# Patient Record
Sex: Female | Born: 1960
Health system: Southern US, Community
[De-identification: ages and names within clinical notes are randomized; demographics above are authoritative.]

## PROBLEM LIST (undated history)

## (undated) DIAGNOSIS — F32A Depression, unspecified: Secondary | ICD-10-CM

## (undated) DIAGNOSIS — F329 Major depressive disorder, single episode, unspecified: Secondary | ICD-10-CM

## (undated) DIAGNOSIS — F419 Anxiety disorder, unspecified: Secondary | ICD-10-CM

## (undated) HISTORY — PX: TUBAL LIGATION: SHX77

## (undated) HISTORY — DX: Depression, unspecified: F32.A

## (undated) HISTORY — PX: FOOT SURGERY: SHX648

## (undated) HISTORY — DX: Anxiety disorder, unspecified: F41.9

## (undated) HISTORY — DX: Major depressive disorder, single episode, unspecified: F32.9

---

## 2003-05-22 ENCOUNTER — Encounter: Admission: RE | Admit: 2003-05-22 | Discharge: 2003-05-22 | Payer: Self-pay | Admitting: Family Medicine

## 2004-07-29 ENCOUNTER — Other Ambulatory Visit: Admission: RE | Admit: 2004-07-29 | Discharge: 2004-07-29 | Payer: Self-pay | Admitting: Family Medicine

## 2007-01-18 ENCOUNTER — Other Ambulatory Visit: Admission: RE | Admit: 2007-01-18 | Discharge: 2007-01-18 | Payer: Self-pay | Admitting: Family Medicine

## 2009-12-13 ENCOUNTER — Other Ambulatory Visit: Admission: RE | Admit: 2009-12-13 | Discharge: 2009-12-13 | Payer: Self-pay | Admitting: Family Medicine

## 2013-02-28 ENCOUNTER — Other Ambulatory Visit (HOSPITAL_COMMUNITY)
Admission: RE | Admit: 2013-02-28 | Discharge: 2013-02-28 | Disposition: A | Discharge status: Home / Self Care | Payer: Managed Care, Other (non HMO) | Source: Ambulatory Visit | Attending: Family Medicine | Admitting: Family Medicine

## 2013-02-28 ENCOUNTER — Other Ambulatory Visit: Payer: Self-pay | Admitting: Physician Assistant

## 2013-02-28 DIAGNOSIS — Z0000 Encounter for general adult medical examination without abnormal findings: Secondary | ICD-10-CM | POA: Insufficient documentation

## 2014-06-11 ENCOUNTER — Ambulatory Visit (INDEPENDENT_AMBULATORY_CARE_PROVIDER_SITE_OTHER): Payer: Managed Care, Other (non HMO) | Admitting: Family Medicine

## 2014-06-11 VITALS — BP 122/84 | HR 68 | Temp 98.0°F | Resp 16 | Ht 68.0 in | Wt 171.8 lb

## 2014-06-11 DIAGNOSIS — L309 Dermatitis, unspecified: Secondary | ICD-10-CM

## 2014-06-11 MED ORDER — TRIAMCINOLONE ACETONIDE 0.1 % EX CREA: 1.0000 "application " | TOPICAL_CREAM | Freq: Two times a day (BID) | CUTANEOUS | Status: AC

## 2014-06-11 MED ORDER — DOXYCYCLINE HYCLATE 100 MG PO CAPS: 100.0000 mg | ORAL_CAPSULE | Freq: Two times a day (BID) | ORAL | Status: AC

## 2014-06-11 NOTE — Progress Notes (Signed)
Subjective: 53 year old lady who has a rash on her elbows. It is been more prominent on the right but is on the left also. She is noticed it over the past couple of weeks. She is tried to medicate with some Neosporin. She has talked to friends about it and finally didn't know what was causing came in. She asked whether it could be Lyme disease. She does have dogs that she isn't engorged person. She has not had a tick bite. No rash elsewhere body. The area on the right elbow is about 4 x 6 itches, erythematous, uniformly red but well-defined borders. No palpable lesions. The left elbow is smaller, maybe about 3 inches in diameter. No other rashes on her trunk or legs or elsewhere. She does not feel bad. No itching at all.  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 However she has times that she could be exposed to fuels or chemicals.  Objective: The appearing lady in no major distress. She has the erythematous areas as described above. Not palpable.  Assessment: Nonspecific dermatitis  Plan: Decided to treat generically with doxycycline twice daily in case of the slim chance of infection. I informed her that I do not know of Lyme disease ever causing more than one spot. It can cause a generalized rash in addition to the primary site, but this would be atypical.  Also will use some topical hydrocortisone cream. If it is doing worse at anytime she is to return. If it is not better within 7-10 days like her to return

## 2014-06-11 NOTE — Patient Instructions (Signed)
Use the triamcinolone cream twice daily on the rash  Take the doxycycline one twice daily as directed  Come in if worse at anytime, otherwise if not improved come in in 7-10 days. It would probably be best if I could recheck it. You can always call and ask when Dr. Alwyn RenHopper is here and come in on one of my shift so I can look at it. When you sign in you just need to request me.

## 2015-10-04 DIAGNOSIS — R42 Dizziness and giddiness: Secondary | ICD-10-CM | POA: Diagnosis not present

## 2015-10-04 DIAGNOSIS — R001 Bradycardia, unspecified: Secondary | ICD-10-CM | POA: Diagnosis not present

## 2015-10-04 DIAGNOSIS — Z720 Tobacco use: Secondary | ICD-10-CM | POA: Diagnosis not present

## 2015-10-28 DIAGNOSIS — R0602 Shortness of breath: Secondary | ICD-10-CM | POA: Diagnosis not present

## 2015-10-28 DIAGNOSIS — I10 Essential (primary) hypertension: Secondary | ICD-10-CM | POA: Diagnosis not present

## 2015-10-28 DIAGNOSIS — R42 Dizziness and giddiness: Secondary | ICD-10-CM | POA: Diagnosis not present

## 2015-10-28 DIAGNOSIS — Z8249 Family history of ischemic heart disease and other diseases of the circulatory system: Secondary | ICD-10-CM | POA: Diagnosis not present

## 2015-10-29 DIAGNOSIS — Z1231 Encounter for screening mammogram for malignant neoplasm of breast: Secondary | ICD-10-CM | POA: Diagnosis not present

## 2015-11-01 DIAGNOSIS — Z0000 Encounter for general adult medical examination without abnormal findings: Secondary | ICD-10-CM | POA: Diagnosis not present

## 2015-11-01 DIAGNOSIS — I10 Essential (primary) hypertension: Secondary | ICD-10-CM | POA: Diagnosis not present

## 2015-11-01 DIAGNOSIS — E785 Hyperlipidemia, unspecified: Secondary | ICD-10-CM | POA: Diagnosis not present

## 2015-11-01 DIAGNOSIS — E559 Vitamin D deficiency, unspecified: Secondary | ICD-10-CM | POA: Diagnosis not present

## 2015-11-01 DIAGNOSIS — Z720 Tobacco use: Secondary | ICD-10-CM | POA: Diagnosis not present

## 2015-11-15 ENCOUNTER — Other Ambulatory Visit: Payer: Self-pay | Admitting: Cardiology

## 2015-11-15 DIAGNOSIS — Z8249 Family history of ischemic heart disease and other diseases of the circulatory system: Secondary | ICD-10-CM

## 2015-11-15 DIAGNOSIS — R0609 Other forms of dyspnea: Secondary | ICD-10-CM | POA: Diagnosis not present

## 2015-11-15 DIAGNOSIS — R0600 Dyspnea, unspecified: Secondary | ICD-10-CM | POA: Diagnosis not present

## 2015-11-23 ENCOUNTER — Other Ambulatory Visit: Payer: Managed Care, Other (non HMO)

## 2016-01-06 ENCOUNTER — Ambulatory Visit
Admission: RE | Admit: 2016-01-06 | Discharge: 2016-01-06 | Disposition: A | Discharge status: Home / Self Care | Payer: No Typology Code available for payment source | Source: Ambulatory Visit | Attending: Cardiology | Admitting: Cardiology

## 2016-01-06 DIAGNOSIS — Z8249 Family history of ischemic heart disease and other diseases of the circulatory system: Secondary | ICD-10-CM

## 2016-02-01 DIAGNOSIS — H35351 Cystoid macular degeneration, right eye: Secondary | ICD-10-CM | POA: Diagnosis not present

## 2016-11-13 DIAGNOSIS — Z1231 Encounter for screening mammogram for malignant neoplasm of breast: Secondary | ICD-10-CM | POA: Diagnosis not present

## 2016-11-17 ENCOUNTER — Other Ambulatory Visit (HOSPITAL_COMMUNITY)
Admission: RE | Admit: 2016-11-17 | Discharge: 2016-11-17 | Disposition: A | Discharge status: Home / Self Care | Payer: BLUE CROSS/BLUE SHIELD | Source: Ambulatory Visit | Attending: Physician Assistant | Admitting: Physician Assistant

## 2016-11-17 ENCOUNTER — Other Ambulatory Visit: Payer: Self-pay | Admitting: Physician Assistant

## 2016-11-17 DIAGNOSIS — E559 Vitamin D deficiency, unspecified: Secondary | ICD-10-CM | POA: Diagnosis not present

## 2016-11-17 DIAGNOSIS — Z0000 Encounter for general adult medical examination without abnormal findings: Secondary | ICD-10-CM | POA: Insufficient documentation

## 2016-11-17 DIAGNOSIS — Z124 Encounter for screening for malignant neoplasm of cervix: Secondary | ICD-10-CM | POA: Diagnosis not present

## 2016-11-17 DIAGNOSIS — Z720 Tobacco use: Secondary | ICD-10-CM | POA: Diagnosis not present

## 2016-11-17 DIAGNOSIS — I10 Essential (primary) hypertension: Secondary | ICD-10-CM | POA: Diagnosis not present

## 2016-11-17 DIAGNOSIS — E785 Hyperlipidemia, unspecified: Secondary | ICD-10-CM | POA: Diagnosis not present

## 2016-11-17 DIAGNOSIS — F418 Other specified anxiety disorders: Secondary | ICD-10-CM | POA: Diagnosis not present

## 2016-11-22 LAB — CYTOLOGY - PAP
Adequacy: ABSENT
Diagnosis: NEGATIVE
HPV: DETECTED — AB

## 2016-11-23 MED ORDER — FENTANYL CITRATE (PF) 100 MCG/2ML IJ SOLN
INTRAMUSCULAR | Status: AC
  Filled 2016-11-23: qty 2

## 2017-07-24 IMAGING — CT CT HEART SCORING
1 of 2 series · 11 of 20 positions shown, 14 images · non-contrast
Comparison: None.

CLINICAL DATA: Family history of ischemic heart disease.  Smoker.

EXAM:
CT HEART FOR CALCIUM SCORING
TECHNIQUE: CT heart was performed on a 256 channel system using prospective ECG
gating. A scout and noncontrast exam (for calcium scoring) were
performed. Note that this exam targets the heart and the chest was
not imaged in its entirety.

[Series 2: smartscore - gated 0.4 sec · axial · 0.49mm/px · z∈[-264,-149]mm · 11 of 56 slices shown, 14 images]
[im 5/56  vessel]
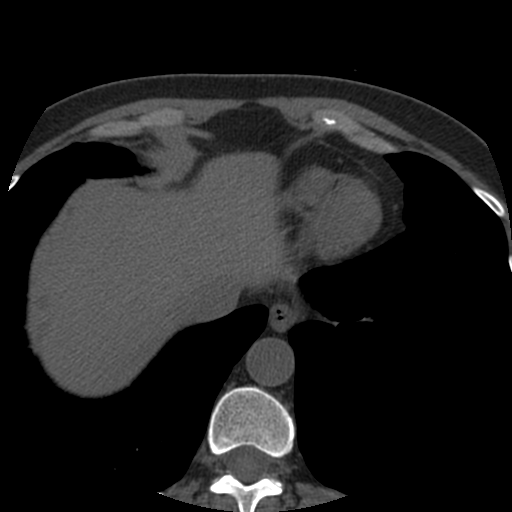
[im 5/56  lung]
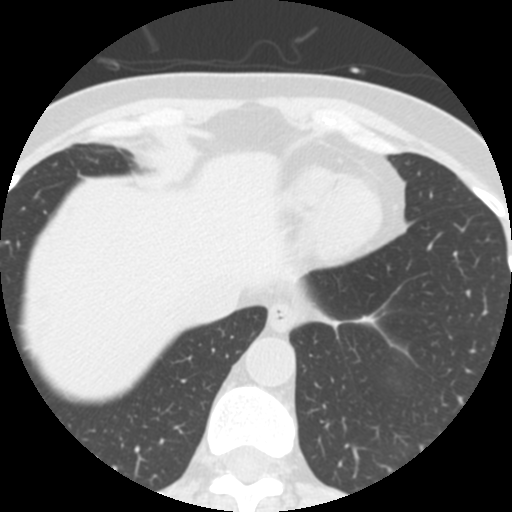
[im 10/56  vessel]
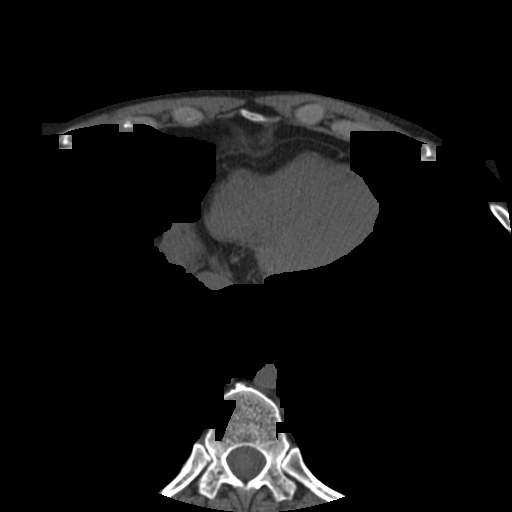
[im 14/56  vessel]
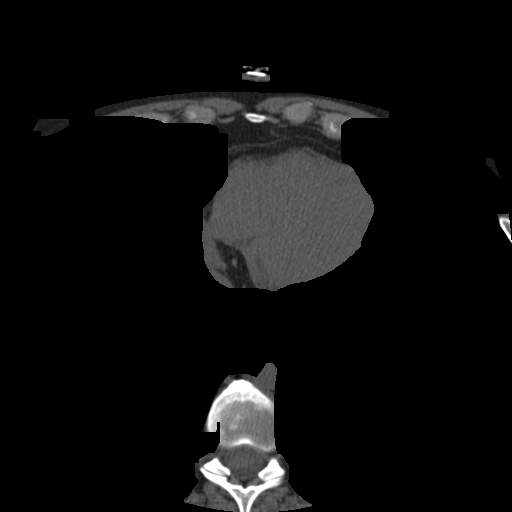
[im 19/56  vessel]
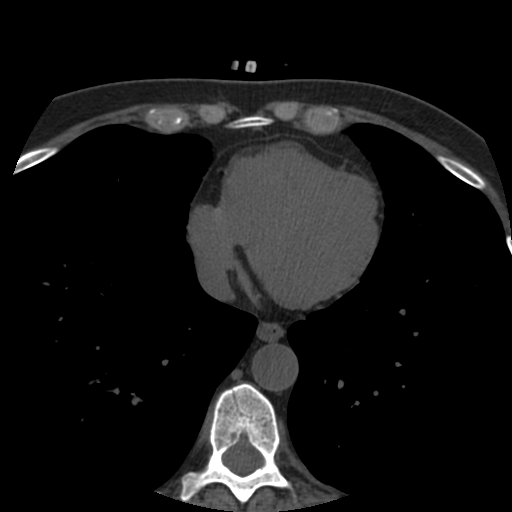
[im 23/56  vessel]
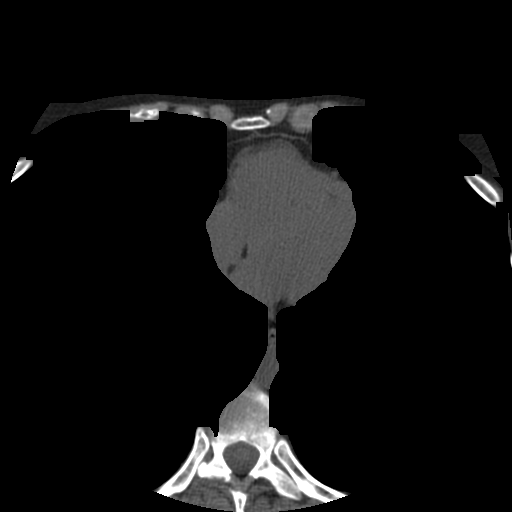
[im 23/56  lung]
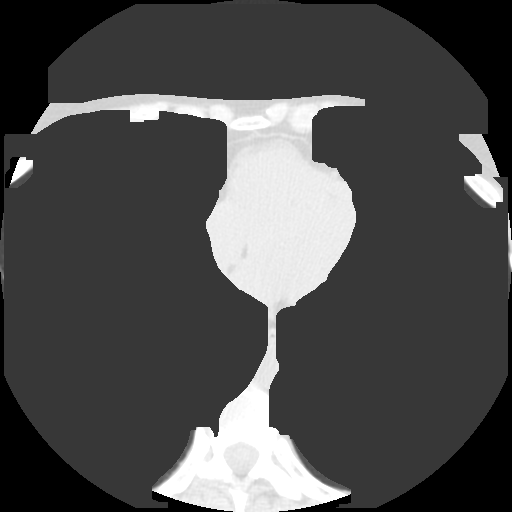
[im 28/56  vessel]
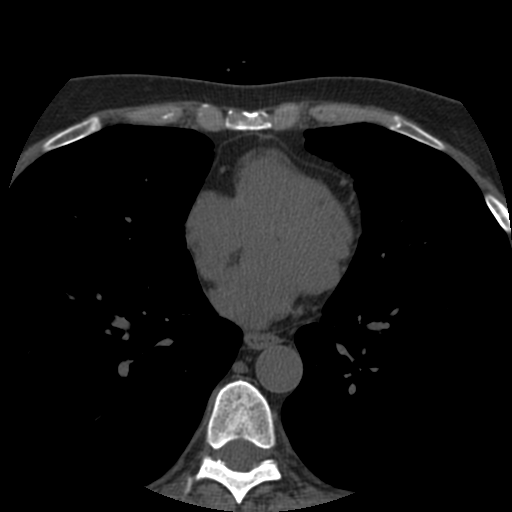
[im 33/56  vessel]
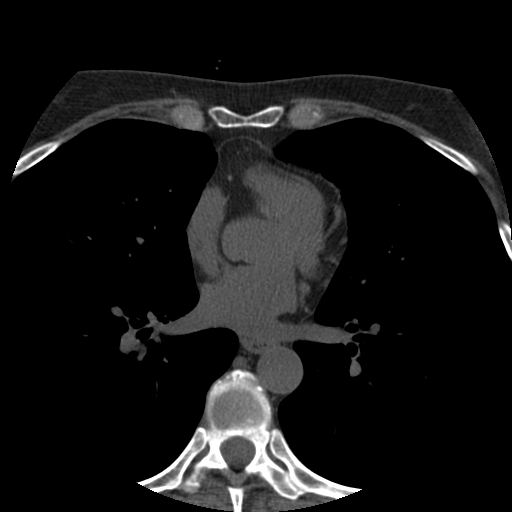
[im 37/56  vessel]
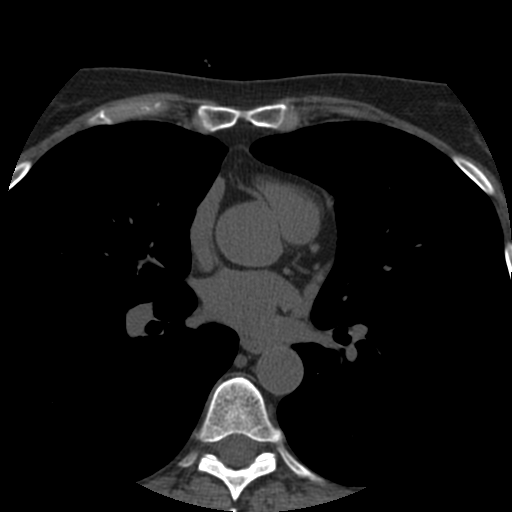
[im 42/56  vessel]
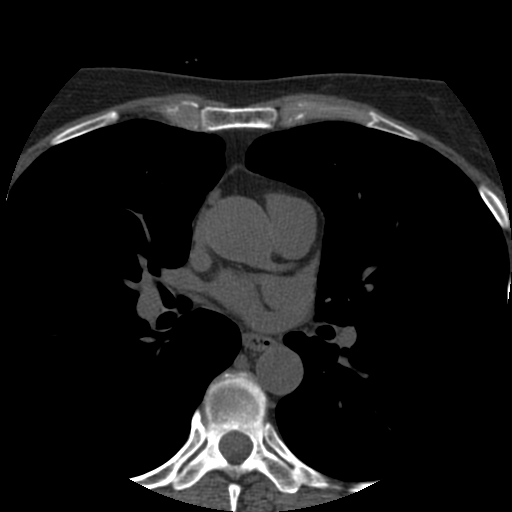
[im 42/56  lung]
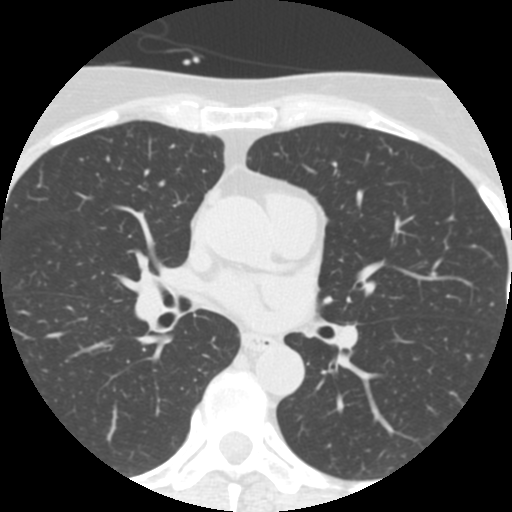
[im 46/56  vessel]
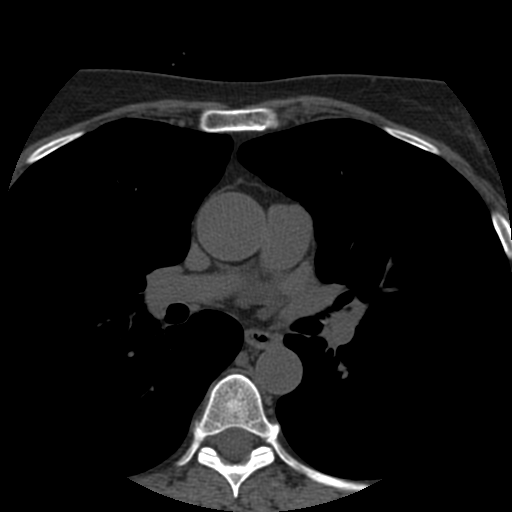
[im 51/56  vessel]
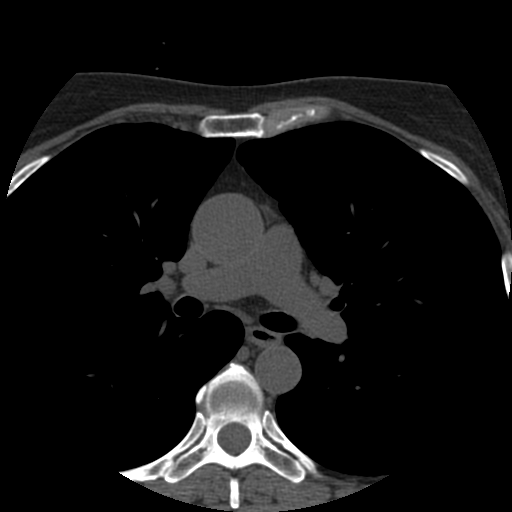

[11 of 20 positions shown; findings below may reference images not displayed]

FINDINGS: Technical quality: Excellent

CORONARY CALCIUM

Total Agatston Score: 0

[HOSPITAL] percentile:  0

AORTA AND PULMONARY MEASUREMENTS:

Ascending aorta ( <  40 mm): 32

Descending aorta ( <  40 mm): 23

Main pulmonary artery:  ( <  30 mm): 22

EXTRACARDIAC FINDINGS:

Limited view of the lung parenchyma demonstrates no suspicious
nodularity. Airways are normal.

Limited view of the mediastinum demonstrates no adenopathy.
Esophagus normal.

Limited view of the upper abdomen unremarkable.

Limited view of the skeleton and chest wall is unremarkable.
IMPRESSION: 1. No coronary artery calcification.  Calcium score equals 0
2. No significant extracardiac findings.

## 2017-12-24 ENCOUNTER — Other Ambulatory Visit (HOSPITAL_COMMUNITY)
Admission: RE | Admit: 2017-12-24 | Discharge: 2017-12-24 | Disposition: A | Discharge status: Home / Self Care | Payer: BLUE CROSS/BLUE SHIELD | Source: Ambulatory Visit | Attending: Physician Assistant | Admitting: Physician Assistant

## 2017-12-24 ENCOUNTER — Other Ambulatory Visit: Payer: Self-pay | Admitting: Physician Assistant

## 2017-12-24 DIAGNOSIS — E559 Vitamin D deficiency, unspecified: Secondary | ICD-10-CM | POA: Diagnosis not present

## 2017-12-24 DIAGNOSIS — Z0000 Encounter for general adult medical examination without abnormal findings: Secondary | ICD-10-CM | POA: Diagnosis not present

## 2017-12-24 DIAGNOSIS — Z136 Encounter for screening for cardiovascular disorders: Secondary | ICD-10-CM | POA: Diagnosis not present

## 2017-12-24 DIAGNOSIS — F418 Other specified anxiety disorders: Secondary | ICD-10-CM | POA: Diagnosis not present

## 2017-12-24 DIAGNOSIS — I10 Essential (primary) hypertension: Secondary | ICD-10-CM | POA: Diagnosis not present

## 2017-12-24 DIAGNOSIS — E785 Hyperlipidemia, unspecified: Secondary | ICD-10-CM | POA: Diagnosis not present

## 2017-12-26 LAB — CYTOLOGY - PAP
Adequacy: ABSENT
Diagnosis: NEGATIVE
HPV: DETECTED — AB

## 2018-04-22 DIAGNOSIS — Z86010 Personal history of colonic polyps: Secondary | ICD-10-CM | POA: Diagnosis not present

## 2018-04-22 DIAGNOSIS — Z8371 Family history of colonic polyps: Secondary | ICD-10-CM | POA: Diagnosis not present

## 2018-04-22 DIAGNOSIS — D126 Benign neoplasm of colon, unspecified: Secondary | ICD-10-CM | POA: Diagnosis not present

## 2018-04-24 DIAGNOSIS — D126 Benign neoplasm of colon, unspecified: Secondary | ICD-10-CM | POA: Diagnosis not present

## 2018-07-24 DIAGNOSIS — H35033 Hypertensive retinopathy, bilateral: Secondary | ICD-10-CM | POA: Diagnosis not present

## 2018-12-27 DIAGNOSIS — E785 Hyperlipidemia, unspecified: Secondary | ICD-10-CM | POA: Diagnosis not present

## 2018-12-27 DIAGNOSIS — I10 Essential (primary) hypertension: Secondary | ICD-10-CM | POA: Diagnosis not present

## 2018-12-27 DIAGNOSIS — F418 Other specified anxiety disorders: Secondary | ICD-10-CM | POA: Diagnosis not present

## 2018-12-27 DIAGNOSIS — E559 Vitamin D deficiency, unspecified: Secondary | ICD-10-CM | POA: Diagnosis not present

## 2018-12-27 DIAGNOSIS — Z0000 Encounter for general adult medical examination without abnormal findings: Secondary | ICD-10-CM | POA: Diagnosis not present

## 2019-01-01 ENCOUNTER — Other Ambulatory Visit: Payer: Self-pay | Admitting: Physician Assistant

## 2019-01-01 ENCOUNTER — Other Ambulatory Visit (HOSPITAL_COMMUNITY)
Admission: RE | Admit: 2019-01-01 | Discharge: 2019-01-01 | Disposition: A | Discharge status: Home / Self Care | Payer: BC Managed Care – PPO | Source: Ambulatory Visit | Attending: Physician Assistant | Admitting: Physician Assistant

## 2019-01-01 DIAGNOSIS — R87810 Cervical high risk human papillomavirus (HPV) DNA test positive: Secondary | ICD-10-CM | POA: Diagnosis not present

## 2019-01-01 DIAGNOSIS — Z1151 Encounter for screening for human papillomavirus (HPV): Secondary | ICD-10-CM | POA: Diagnosis not present

## 2019-01-01 DIAGNOSIS — E559 Vitamin D deficiency, unspecified: Secondary | ICD-10-CM | POA: Diagnosis not present

## 2019-01-01 DIAGNOSIS — Z0000 Encounter for general adult medical examination without abnormal findings: Secondary | ICD-10-CM | POA: Diagnosis not present

## 2019-01-01 DIAGNOSIS — Z0001 Encounter for general adult medical examination with abnormal findings: Secondary | ICD-10-CM | POA: Diagnosis not present

## 2019-01-01 DIAGNOSIS — Z23 Encounter for immunization: Secondary | ICD-10-CM | POA: Diagnosis not present

## 2019-01-01 DIAGNOSIS — I10 Essential (primary) hypertension: Secondary | ICD-10-CM | POA: Diagnosis not present

## 2019-01-01 DIAGNOSIS — E785 Hyperlipidemia, unspecified: Secondary | ICD-10-CM | POA: Diagnosis not present

## 2019-01-02 LAB — CYTOLOGY - PAP
Adequacy: ABSENT
Diagnosis: NEGATIVE
HPV: DETECTED — AB

## 2019-05-22 DIAGNOSIS — J029 Acute pharyngitis, unspecified: Secondary | ICD-10-CM | POA: Diagnosis not present

## 2019-05-22 DIAGNOSIS — Z20828 Contact with and (suspected) exposure to other viral communicable diseases: Secondary | ICD-10-CM | POA: Diagnosis not present

## 2019-12-08 DIAGNOSIS — I10 Essential (primary) hypertension: Secondary | ICD-10-CM | POA: Diagnosis not present

## 2019-12-08 DIAGNOSIS — E785 Hyperlipidemia, unspecified: Secondary | ICD-10-CM | POA: Diagnosis not present

## 2019-12-08 DIAGNOSIS — F418 Other specified anxiety disorders: Secondary | ICD-10-CM | POA: Diagnosis not present

## 2019-12-08 DIAGNOSIS — E559 Vitamin D deficiency, unspecified: Secondary | ICD-10-CM | POA: Diagnosis not present

## 2020-01-01 DIAGNOSIS — Z720 Tobacco use: Secondary | ICD-10-CM | POA: Diagnosis not present

## 2020-01-01 DIAGNOSIS — F418 Other specified anxiety disorders: Secondary | ICD-10-CM | POA: Diagnosis not present

## 2020-01-01 DIAGNOSIS — R42 Dizziness and giddiness: Secondary | ICD-10-CM | POA: Diagnosis not present

## 2020-03-01 DIAGNOSIS — H35033 Hypertensive retinopathy, bilateral: Secondary | ICD-10-CM | POA: Diagnosis not present

## 2020-04-15 DIAGNOSIS — Z20822 Contact with and (suspected) exposure to covid-19: Secondary | ICD-10-CM | POA: Diagnosis not present

## 2020-04-20 DIAGNOSIS — Z20822 Contact with and (suspected) exposure to covid-19: Secondary | ICD-10-CM | POA: Diagnosis not present

## 2020-04-23 DIAGNOSIS — Z20822 Contact with and (suspected) exposure to covid-19: Secondary | ICD-10-CM | POA: Diagnosis not present

## 2020-04-26 DIAGNOSIS — Z20822 Contact with and (suspected) exposure to covid-19: Secondary | ICD-10-CM | POA: Diagnosis not present

## 2020-05-06 DIAGNOSIS — Z20822 Contact with and (suspected) exposure to covid-19: Secondary | ICD-10-CM | POA: Diagnosis not present

## 2020-05-10 DIAGNOSIS — Z20822 Contact with and (suspected) exposure to covid-19: Secondary | ICD-10-CM | POA: Diagnosis not present

## 2020-05-20 DIAGNOSIS — Z20822 Contact with and (suspected) exposure to covid-19: Secondary | ICD-10-CM | POA: Diagnosis not present

## 2020-05-24 DIAGNOSIS — Z20822 Contact with and (suspected) exposure to covid-19: Secondary | ICD-10-CM | POA: Diagnosis not present

## 2020-06-03 DIAGNOSIS — Z20822 Contact with and (suspected) exposure to covid-19: Secondary | ICD-10-CM | POA: Diagnosis not present

## 2020-06-07 ENCOUNTER — Other Ambulatory Visit: Payer: Self-pay | Admitting: Physician Assistant

## 2020-06-07 ENCOUNTER — Other Ambulatory Visit (HOSPITAL_COMMUNITY)
Admission: RE | Admit: 2020-06-07 | Discharge: 2020-06-07 | Disposition: A | Discharge status: Home / Self Care | Payer: BC Managed Care – PPO | Source: Ambulatory Visit | Attending: Physician Assistant | Admitting: Physician Assistant

## 2020-06-07 DIAGNOSIS — Z720 Tobacco use: Secondary | ICD-10-CM | POA: Diagnosis not present

## 2020-06-07 DIAGNOSIS — E785 Hyperlipidemia, unspecified: Secondary | ICD-10-CM | POA: Diagnosis not present

## 2020-06-07 DIAGNOSIS — Z0000 Encounter for general adult medical examination without abnormal findings: Secondary | ICD-10-CM | POA: Diagnosis not present

## 2020-06-07 DIAGNOSIS — I10 Essential (primary) hypertension: Secondary | ICD-10-CM | POA: Diagnosis not present

## 2020-06-07 DIAGNOSIS — Z23 Encounter for immunization: Secondary | ICD-10-CM | POA: Diagnosis not present

## 2020-06-07 DIAGNOSIS — F418 Other specified anxiety disorders: Secondary | ICD-10-CM | POA: Diagnosis not present

## 2020-06-10 LAB — CYTOLOGY - PAP
Adequacy: ABSENT
Comment: NEGATIVE
Diagnosis: NEGATIVE
High risk HPV: POSITIVE — AB

## 2020-06-18 DIAGNOSIS — U071 COVID-19: Secondary | ICD-10-CM | POA: Diagnosis not present

## 2021-02-15 DIAGNOSIS — Z1231 Encounter for screening mammogram for malignant neoplasm of breast: Secondary | ICD-10-CM | POA: Diagnosis not present

## 2021-04-25 DIAGNOSIS — I10 Essential (primary) hypertension: Secondary | ICD-10-CM | POA: Diagnosis not present

## 2021-04-25 DIAGNOSIS — F418 Other specified anxiety disorders: Secondary | ICD-10-CM | POA: Diagnosis not present

## 2021-04-25 DIAGNOSIS — E785 Hyperlipidemia, unspecified: Secondary | ICD-10-CM | POA: Diagnosis not present

## 2022-02-21 DIAGNOSIS — Z1231 Encounter for screening mammogram for malignant neoplasm of breast: Secondary | ICD-10-CM | POA: Diagnosis not present

## 2022-04-26 ENCOUNTER — Other Ambulatory Visit: Payer: Self-pay | Admitting: Physician Assistant

## 2022-04-26 DIAGNOSIS — Z720 Tobacco use: Secondary | ICD-10-CM | POA: Diagnosis not present

## 2022-04-26 DIAGNOSIS — E785 Hyperlipidemia, unspecified: Secondary | ICD-10-CM | POA: Diagnosis not present

## 2022-04-26 DIAGNOSIS — I10 Essential (primary) hypertension: Secondary | ICD-10-CM | POA: Diagnosis not present

## 2022-08-28 DIAGNOSIS — H35033 Hypertensive retinopathy, bilateral: Secondary | ICD-10-CM | POA: Diagnosis not present

## 2023-02-01 DIAGNOSIS — Z720 Tobacco use: Secondary | ICD-10-CM | POA: Diagnosis not present

## 2023-02-01 DIAGNOSIS — E2839 Other primary ovarian failure: Secondary | ICD-10-CM | POA: Diagnosis not present

## 2023-02-01 DIAGNOSIS — Z0000 Encounter for general adult medical examination without abnormal findings: Secondary | ICD-10-CM | POA: Diagnosis not present

## 2023-02-01 DIAGNOSIS — I10 Essential (primary) hypertension: Secondary | ICD-10-CM | POA: Diagnosis not present

## 2023-02-01 DIAGNOSIS — E785 Hyperlipidemia, unspecified: Secondary | ICD-10-CM | POA: Diagnosis not present

## 2023-02-12 ENCOUNTER — Encounter: Payer: Self-pay | Admitting: Physician Assistant

## 2023-02-14 ENCOUNTER — Ambulatory Visit
Admission: RE | Admit: 2023-02-14 | Discharge: 2023-02-14 | Disposition: A | Discharge status: Home / Self Care | Payer: BC Managed Care – PPO | Source: Ambulatory Visit | Attending: Physician Assistant | Admitting: Physician Assistant

## 2023-02-14 DIAGNOSIS — Z720 Tobacco use: Secondary | ICD-10-CM

## 2023-02-14 DIAGNOSIS — F17210 Nicotine dependence, cigarettes, uncomplicated: Secondary | ICD-10-CM | POA: Diagnosis not present

## 2023-02-21 ENCOUNTER — Other Ambulatory Visit (HOSPITAL_COMMUNITY)
Admission: RE | Admit: 2023-02-21 | Discharge: 2023-02-21 | Disposition: A | Discharge status: Home / Self Care | Payer: BC Managed Care – PPO | Source: Ambulatory Visit | Attending: Nurse Practitioner | Admitting: Nurse Practitioner

## 2023-02-21 DIAGNOSIS — Z124 Encounter for screening for malignant neoplasm of cervix: Secondary | ICD-10-CM | POA: Diagnosis not present

## 2023-02-21 DIAGNOSIS — Z01419 Encounter for gynecological examination (general) (routine) without abnormal findings: Secondary | ICD-10-CM | POA: Diagnosis not present

## 2023-02-23 DIAGNOSIS — E349 Endocrine disorder, unspecified: Secondary | ICD-10-CM | POA: Diagnosis not present

## 2023-02-23 DIAGNOSIS — Z1231 Encounter for screening mammogram for malignant neoplasm of breast: Secondary | ICD-10-CM | POA: Diagnosis not present

## 2023-02-23 DIAGNOSIS — N958 Other specified menopausal and perimenopausal disorders: Secondary | ICD-10-CM | POA: Diagnosis not present

## 2023-02-23 DIAGNOSIS — F17210 Nicotine dependence, cigarettes, uncomplicated: Secondary | ICD-10-CM | POA: Diagnosis not present

## 2023-02-28 LAB — CYTOLOGY - PAP
Comment: NEGATIVE
Comment: NEGATIVE
Comment: NEGATIVE
Diagnosis: UNDETERMINED — AB
HPV 16: NEGATIVE
HPV 18 / 45: NEGATIVE
High risk HPV: POSITIVE — AB

## 2023-03-07 DIAGNOSIS — R748 Abnormal levels of other serum enzymes: Secondary | ICD-10-CM | POA: Diagnosis not present

## 2023-03-07 DIAGNOSIS — R945 Abnormal results of liver function studies: Secondary | ICD-10-CM | POA: Diagnosis not present

## 2023-04-16 ENCOUNTER — Other Ambulatory Visit: Payer: Self-pay

## 2023-04-16 DIAGNOSIS — R87810 Cervical high risk human papillomavirus (HPV) DNA test positive: Secondary | ICD-10-CM | POA: Diagnosis not present

## 2023-04-16 DIAGNOSIS — R87610 Atypical squamous cells of undetermined significance on cytologic smear of cervix (ASC-US): Secondary | ICD-10-CM | POA: Diagnosis not present

## 2023-04-19 LAB — SURGICAL PATHOLOGY

## 2024-02-11 ENCOUNTER — Other Ambulatory Visit (HOSPITAL_BASED_OUTPATIENT_CLINIC_OR_DEPARTMENT_OTHER): Payer: Self-pay | Admitting: Family Medicine

## 2024-02-11 DIAGNOSIS — I2510 Atherosclerotic heart disease of native coronary artery without angina pectoris: Secondary | ICD-10-CM

## 2024-02-15 ENCOUNTER — Ambulatory Visit (HOSPITAL_BASED_OUTPATIENT_CLINIC_OR_DEPARTMENT_OTHER)
Admission: RE | Admit: 2024-02-15 | Discharge: 2024-02-15 | Disposition: A | Discharge status: Home / Self Care | Payer: Self-pay | Source: Ambulatory Visit | Attending: Family Medicine | Admitting: Family Medicine

## 2024-02-15 DIAGNOSIS — I2510 Atherosclerotic heart disease of native coronary artery without angina pectoris: Secondary | ICD-10-CM | POA: Insufficient documentation
